# Patient Record
Sex: Female | Born: 2000 | Race: White | Hispanic: No | Marital: Single | Smoking: Never smoker
Health system: Southern US, Community
[De-identification: ages and names within clinical notes are randomized; demographics above are authoritative.]

---
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
## 2021-05-02 IMAGING — CR DG FACIAL BONES COMPLETE 3+V
3 series · 3 of 3 positions shown · non-contrast
Comparison: None.

CLINICAL DATA: Facial trauma playing basketball.

EXAM:
FACIAL BONES COMPLETE 3+V

[[person_name]]
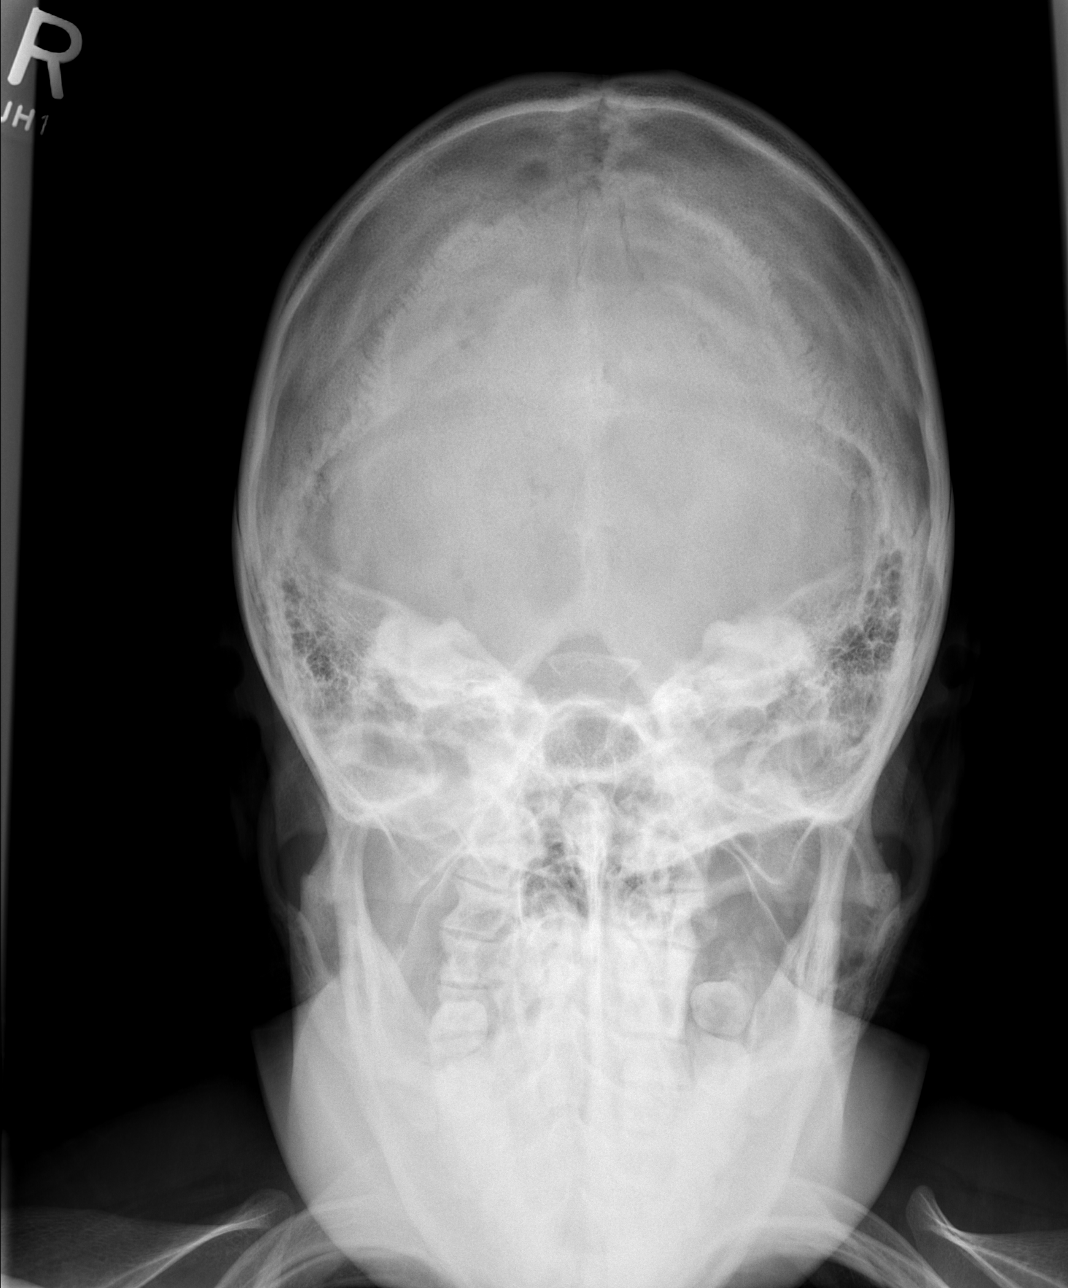

[w skull lat]
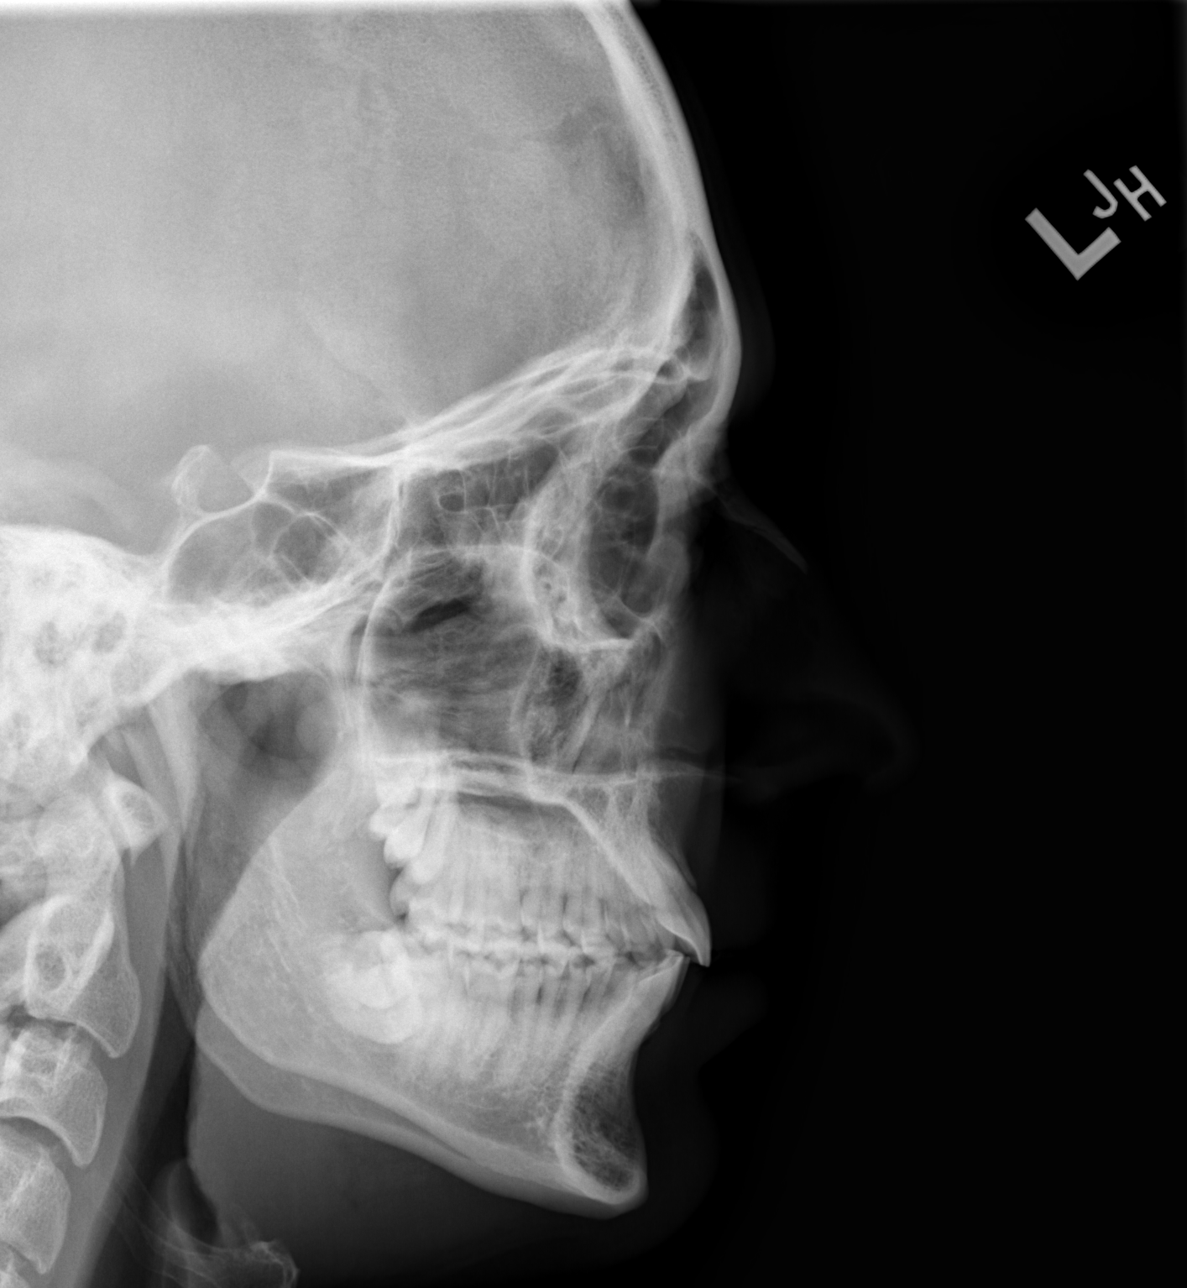

[w waters]
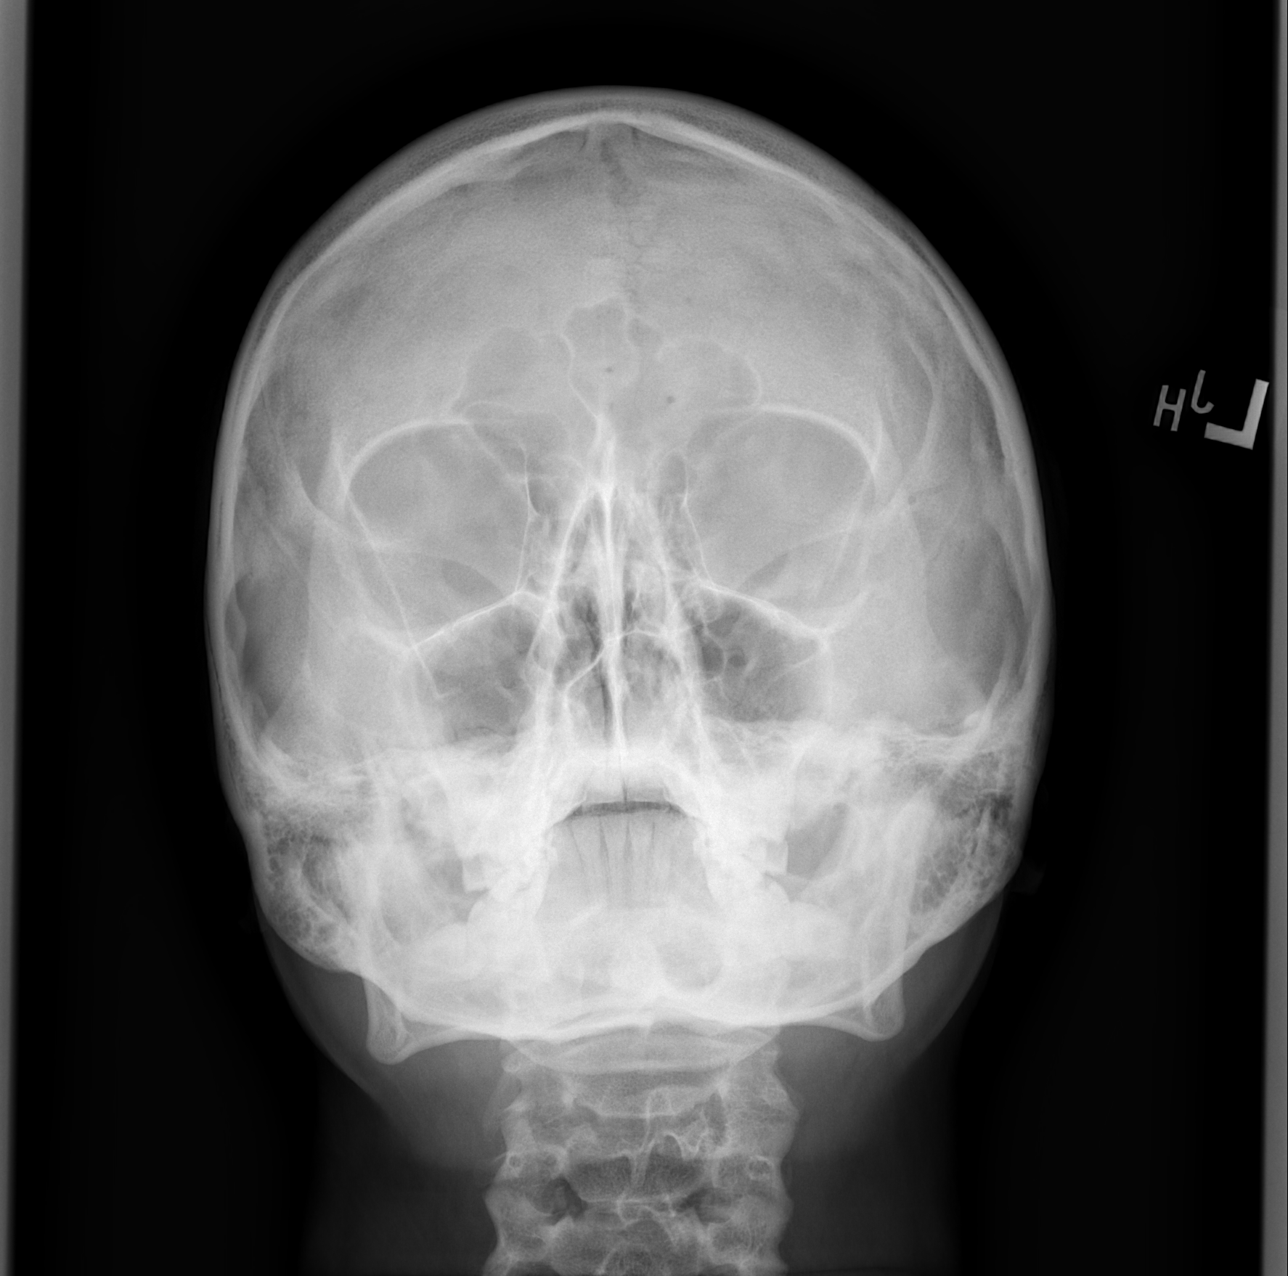

[3 of 3 positions shown; findings below may reference images not displayed]

FINDINGS: Transverse nondisplaced fracture of the nasal bone. No orbital
emphysema or sinus air-fluid levels are seen.
IMPRESSION: Transverse nondisplaced fracture of the nasal bone.

## 2022-07-19 ENCOUNTER — Ambulatory Visit: Payer: BC Managed Care – PPO | Admitting: Internal Medicine

## 2022-07-19 NOTE — Progress Notes (Deleted)
     Patient: Chelsea Potts  DOB: [birth year] MRN: [id number] PCP: Stevphen Meuse, MD      Subjective:  Chelsea Potts is a 22 y.o. F with PMHx as below presents with cutaneous lesions.  Followed by Dr. Denese Killings, MD for rash.  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  Referred to ID urgently.  Patient is on Bactrim through on May 28.  She was improved x 10 days in [place] for 4 months and visit about a while before that.  She developed rash on right upper leg which started on 06/25/2022.  Noted that it started to spread at that point at 07/01/2022 visit.  She woke up day prior to visit and noticed her throat was swollen and states that she unsure if this is because of rash.  Described rash on as red bumps that are raised.  Noted that right upper leg erythematous with raised pruritic nodules surrounding area smaller.  Nonerythematous skin colored raised papules.  This was a video visit so was difficult to visualize.  ROS  No past medical history on file.  Outpatient Medications Prior to Visit  Medication Sig Dispense Refill   NEOMYCIN-POLYMYXIN-HYDROCORTISONE (CORTISPORIN) 1 % SOLN OTIC solution Apply 1-2 drops to toe BID after soaking 10 mL 1   sertraline (ZOLOFT) 25 MG tablet Take 25 mg by mouth daily.     No facility-administered medications prior to visit.     No Known Allergies  Social History   Tobacco Use   Smoking status: Never   Smokeless tobacco: Never  Substance Use Topics   Alcohol use: Never    No family history on file.  Objective:  There were no vitals filed for this visit. There is no height or weight on file to calculate BMI.  Physical Exam  Lab Results: No results found for: "WBC", "HGB", "HCT", "MCV", "PLT" No results found for: "CREATININE", "BUN", "NA", "K", "CL", "CO2" No results found for: "ALT", "AST", "GGT", "ALKPHOS",  "BILITOT"   Assessment & Plan:    Danelle Earthly, MD [facility]   07/19/22  2:21 PM

## 2022-10-07 ENCOUNTER — Institutional Professional Consult (permissible substitution): Payer: BC Managed Care – PPO | Admitting: Neurology
